# Patient Record
Sex: Male | Born: 1995 | Hispanic: No | Marital: Single | Smoking: Current some day smoker
Health system: Southern US, Community
[De-identification: ages and names within clinical notes are randomized; demographics above are authoritative.]

---

## 2021-09-04 ENCOUNTER — Emergency Department (HOSPITAL_BASED_OUTPATIENT_CLINIC_OR_DEPARTMENT_OTHER): Payer: BC Managed Care – PPO

## 2021-09-04 ENCOUNTER — Encounter (HOSPITAL_BASED_OUTPATIENT_CLINIC_OR_DEPARTMENT_OTHER): Payer: Self-pay | Admitting: *Deleted

## 2021-09-04 ENCOUNTER — Other Ambulatory Visit: Payer: Self-pay

## 2021-09-04 ENCOUNTER — Emergency Department (HOSPITAL_BASED_OUTPATIENT_CLINIC_OR_DEPARTMENT_OTHER)
Admission: EM | Admit: 2021-09-04 | Discharge: 2021-09-04 | Disposition: A | Discharge status: Home / Self Care | Payer: BC Managed Care – PPO | Attending: Emergency Medicine | Admitting: Emergency Medicine

## 2021-09-04 DIAGNOSIS — R109 Unspecified abdominal pain: Secondary | ICD-10-CM | POA: Diagnosis present

## 2021-09-04 DIAGNOSIS — K209 Esophagitis, unspecified without bleeding: Secondary | ICD-10-CM | POA: Insufficient documentation

## 2021-09-04 LAB — COMPREHENSIVE METABOLIC PANEL
ALT: 25 U/L (ref 0–44)
AST: 24 U/L (ref 15–41)
Albumin: 4.4 g/dL (ref 3.5–5.0)
Alkaline Phosphatase: 73 U/L (ref 38–126)
Anion gap: 11 (ref 5–15)
BUN: 9 mg/dL (ref 6–20)
CO2: 21 mmol/L — ABNORMAL LOW (ref 22–32)
Calcium: 8.9 mg/dL (ref 8.9–10.3)
Chloride: 104 mmol/L (ref 98–111)
Creatinine, Ser: 0.93 mg/dL (ref 0.61–1.24)
GFR, Estimated: >60 mL/min (ref >60)
Glucose, Bld: 92 mg/dL (ref 70–99)
Potassium: 3.6 mmol/L (ref 3.5–5.1)
Sodium: 136 mmol/L (ref 135–145)
Total Bilirubin: 0.7 mg/dL (ref 0.3–1.2)
Total Protein: 7.6 g/dL (ref 6.5–8.1)

## 2021-09-04 LAB — CBC
HCT: 44.5 % (ref 39.0–52.0)
Hemoglobin: 15.7 g/dL (ref 13.0–17.0)
MCH: 30.8 pg (ref 26.0–34.0)
MCHC: 35.3 g/dL (ref 30.0–36.0)
MCV: 87.4 fL (ref 80.0–100.0)
Platelets: 399 10*3/uL (ref 150–400)
RBC: 5.09 MIL/uL (ref 4.22–5.81)
RDW: 12.9 % (ref 11.5–15.5)
WBC: 8.3 10*3/uL (ref 4.0–10.5)
nRBC: 0 % (ref 0.0–0.2)

## 2021-09-04 LAB — LIPASE, BLOOD: Lipase: 25 U/L (ref 11–51)

## 2021-09-04 MED ORDER — OMEPRAZOLE 20 MG PO CPDR: 20.0000 mg | DELAYED_RELEASE_CAPSULE | Freq: Every day | ORAL | 0 refills | Status: AC

## 2021-09-04 MED ORDER — ALUM & MAG HYDROXIDE-SIMETH 200-200-20 MG/5ML PO SUSP
30.0000 mL | Freq: Once | ORAL | Status: AC
  Administered 2021-09-04: 30 mL via ORAL
  Filled 2021-09-04: qty 30

## 2021-09-04 MED ORDER — LIDOCAINE VISCOUS HCL 2 % MT SOLN
15.0000 mL | Freq: Once | OROMUCOSAL | Status: AC
  Administered 2021-09-04: 15 mL via ORAL
  Filled 2021-09-04: qty 15

## 2021-09-04 NOTE — ED Notes (Signed)
Pt had GI cocktail 2251. Shortly after, pt started having anxiety because his throat "felt like it was closing up". HR went up to 150s, 160s. Provider notified

## 2021-09-04 NOTE — ED Triage Notes (Signed)
C/o epigastric pain x 1 month, seen at [facility] ed for same

## 2021-09-04 NOTE — ED Provider Notes (Signed)
[facility] EMERGENCY DEPARTMENT Provider Note   CSN: [id number] Arrival date & time: 09/04/21  2112     History  Chief Complaint  Patient presents with   Abdominal Pain    Dean Frey. is a 26 y.o. male.   Abdominal Pain Associated symptoms: chest pain   Associated symptoms: no constipation and no shortness of breath   Patient presents with pain in his chest.  It is from his throat down his anterior chest.  Has had issues after eating.  Has had for the last few weeks.  Sometimes worse with laying down.  Not worse with exertion.  Is not really going to his abdomen.  Does not smoke.  No early cardiac disease.  No weight loss.  States he got seen recently at [facility] for similar symptoms and was told it was chest wall pain.  States the pain is more on the left side at that time.  Patient states he gets nervous with the episodes also.    Home Medications Prior to Admission medications   Medication Sig Start Date End Date Taking? Authorizing Provider  omeprazole (PRILOSEC) 20 MG capsule Take 1 capsule (20 mg total) by mouth daily. 09/04/21  Yes Benjiman Core, MD      Allergies    Patient has no known allergies.    Review of Systems   Review of Systems  HENT:  Negative for congestion.   Respiratory:  Negative for shortness of breath.   Cardiovascular:  Positive for chest pain.  Gastrointestinal:  Negative for abdominal pain and constipation.  Genitourinary:  Negative for flank pain.  Musculoskeletal:  Negative for back pain.  Neurological:  Negative for weakness.  Psychiatric/Behavioral:  Negative for confusion.    Physical Exam Updated Vital Signs BP (!) 132/99    Pulse (!) 109    Temp 98.3 F (36.8 C) (Oral)    Resp 18    Ht 5\' 10"  (1.778 m)    Wt 86.2 kg    SpO2 100%    BMI 27.26 kg/m  Physical Exam Vitals and nursing note reviewed.  HENT:     Head: Atraumatic.  Cardiovascular:     Rate and Rhythm: Normal rate and regular rhythm.  Pulmonary:      Breath sounds: Normal breath sounds.  Abdominal:     Hernia: No hernia is present.  Skin:    Capillary Refill: Capillary refill takes less than 2 seconds.  Neurological:     Mental Status: He is alert and oriented to person, place, and time.    ED Results / Procedures / Treatments   Labs (all labs ordered are listed, but only abnormal results are displayed) Labs Reviewed  COMPREHENSIVE METABOLIC PANEL - Abnormal; Notable for the following components:      Result Value   CO2 21 (*)    All other components within normal limits  LIPASE, BLOOD  CBC    EKG EKG Interpretation  Date/Time:  Tuesday September 04 2021 21:59:58 EST Ventricular Rate:  106 PR Interval:  136 QRS Duration: 86 QT Interval:  312 QTC Calculation: 415 R Axis:   64 Text Interpretation: Sinus tachycardia Borderline repolarization abnormality Baseline wander in lead(s) V6 Confirmed by [phone]) on 09/04/2021 10:54:13 PM  Radiology DG Chest 2 View  Result Date: 09/04/2021 CLINICAL DATA:  Chest and epigastric pain for 1 month EXAM: CHEST - 2 VIEW COMPARISON:  None. FINDINGS: Frontal and lateral views of the chest demonstrate an unremarkable cardiac silhouette. No  airspace disease, effusion, or pneumothorax. No acute bony abnormalities. IMPRESSION: 1. No acute intrathoracic process. Electronically Signed   By: Sharlet Salina M.D.   On: 09/04/2021 21:59    Procedures Procedures    Medications Ordered in ED Medications  alum & mag hydroxide-simeth (MAALOX/MYLANTA) 200-200-20 MG/5ML suspension 30 mL (30 mLs Oral Given 09/04/21 2251)    And  lidocaine (XYLOCAINE) 2 % viscous mouth solution 15 mL (15 mLs Oral Given 09/04/21 2251)    ED Course/ Medical Decision Making/ A&P                           Medical Decision Making Amount and/or Complexity of Data Reviewed Labs: ordered. Radiology: ordered.  Risk OTC drugs. Prescription drug management.   Patient with chest pain.  Anterior chest.   Comes on after eating.  Also may have an anxiety component.  Doubt cardiac ischemia.  No abdominal tenderness.  Work-up reassuring.  EKG reassuring.  However after getting GI cocktail did have a tachycardia.  Resolved once his anxiety cleared up.  States he felt as if his throat was closing up.  Appear to be a sinus tachycardia.  With pain coming on after eating in the mid chest could be esophagitis.  Ideally would follow-up with GI.  We will give proton pump inhibitor for now.  Also had some hypertension.  Will need to follow-up PCP for that.  Will discharge home        Final Clinical Impression(s) / ED Diagnoses Final diagnoses:  Esophagitis    Rx / DC Orders ED Discharge Orders          Ordered    omeprazole (PRILOSEC) 20 MG capsule  Daily        09/04/21 2310              Benjiman Core, MD 09/04/21 [phone]

## 2021-09-04 NOTE — Discharge Instructions (Addendum)
Your blood pressure was also elevated today.  Follow-up with your primary care doctor for that.

## 2021-09-04 NOTE — ED Notes (Signed)
Patient transported to X-ray 

## 2023-01-03 IMAGING — DX DG CHEST 2V
2 series · 2 of 2 positions shown · non-contrast
Comparison: None.

CLINICAL DATA: Chest and epigastric pain for 1 month

EXAM:
CHEST - 2 VIEW

[chest pa]
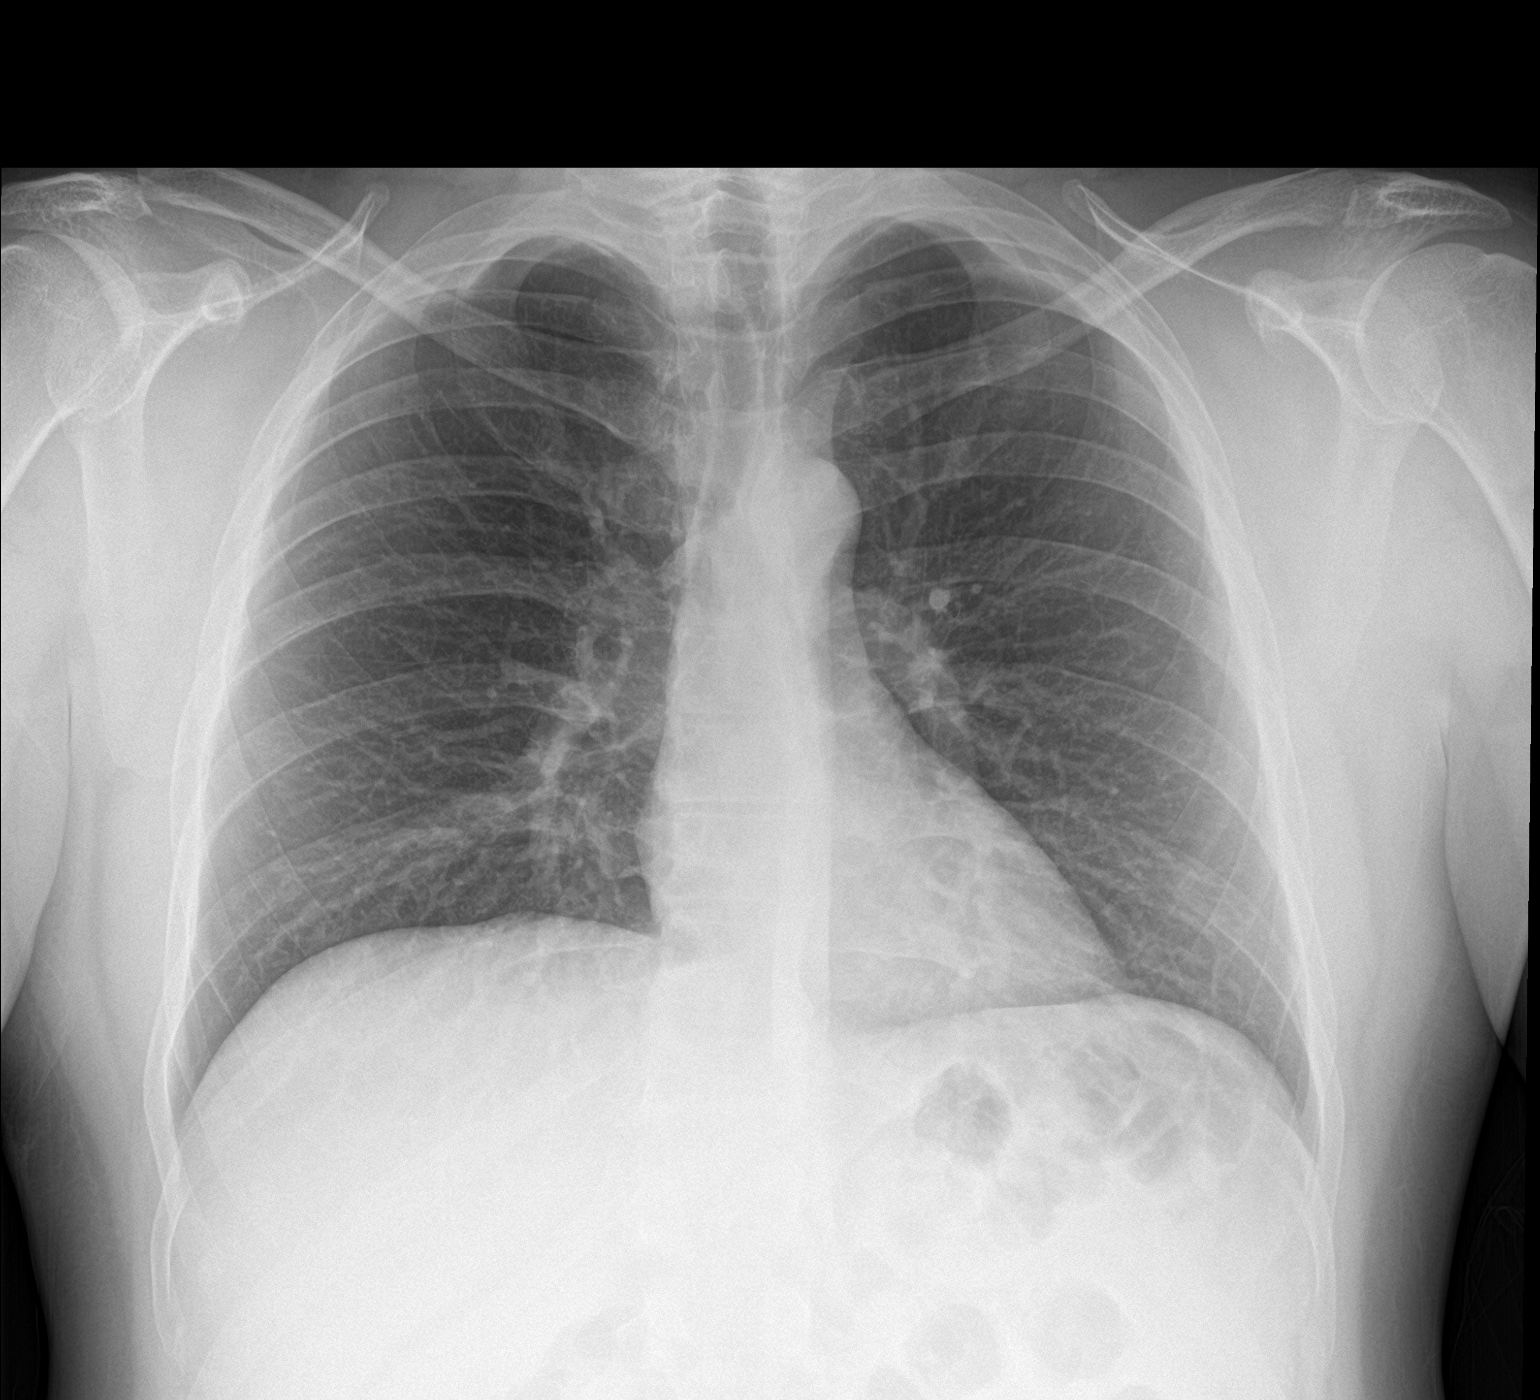

[chest lat]
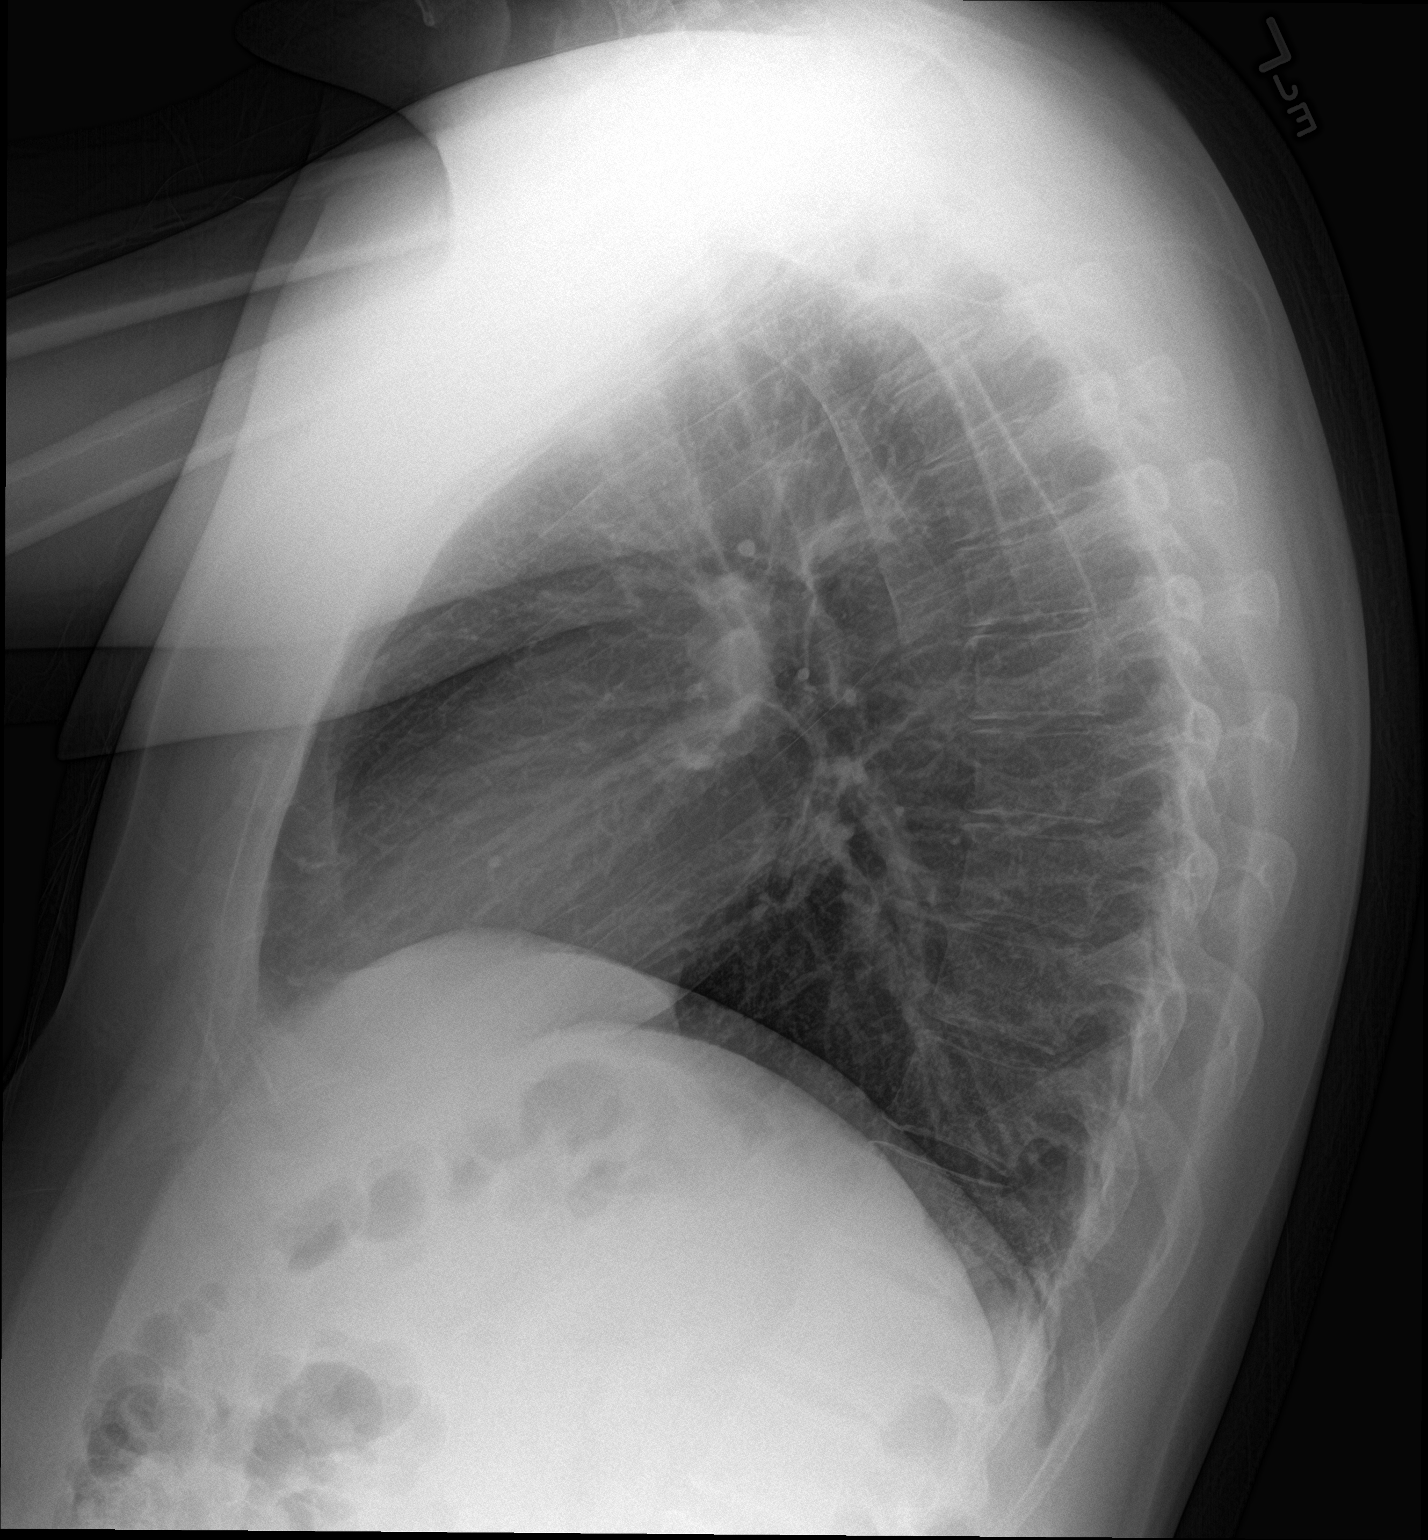

[2 of 2 positions shown; findings below may reference images not displayed]

FINDINGS: Frontal and lateral views of the chest demonstrate an unremarkable
cardiac silhouette. No airspace disease, effusion, or pneumothorax.
No acute bony abnormalities.
IMPRESSION: 1. No acute intrathoracic process.
